# Patient Record
Sex: Male | Born: 1960 | Race: Asian | Hispanic: No | Marital: Married | Smoking: Former smoker
Health system: Southern US, Community
[De-identification: ages and names within clinical notes are randomized; demographics above are authoritative.]

## PROBLEM LIST (undated history)

## (undated) DIAGNOSIS — J189 Pneumonia, unspecified organism: Secondary | ICD-10-CM

## (undated) DIAGNOSIS — Z889 Allergy status to unspecified drugs, medicaments and biological substances status: Secondary | ICD-10-CM

---

## 2009-06-19 ENCOUNTER — Emergency Department (HOSPITAL_COMMUNITY): Admission: EM | Admit: 2009-06-19 | Discharge: 2009-06-19 | Payer: Self-pay | Admitting: Family Medicine

## 2011-10-26 ENCOUNTER — Emergency Department (INDEPENDENT_AMBULATORY_CARE_PROVIDER_SITE_OTHER): Payer: Self-pay

## 2011-10-26 ENCOUNTER — Emergency Department (INDEPENDENT_AMBULATORY_CARE_PROVIDER_SITE_OTHER)
Admission: EM | Admit: 2011-10-26 | Discharge: 2011-10-26 | Disposition: A | Discharge status: Home / Self Care | Payer: Self-pay | Source: Home / Self Care

## 2011-10-26 ENCOUNTER — Encounter (HOSPITAL_COMMUNITY): Payer: Self-pay | Admitting: *Deleted

## 2011-10-26 DIAGNOSIS — J40 Bronchitis, not specified as acute or chronic: Secondary | ICD-10-CM

## 2011-10-26 HISTORY — DX: Allergy status to unspecified drugs, medicaments and biological substances: Z88.9

## 2011-10-26 HISTORY — DX: Pneumonia, unspecified organism: J18.9

## 2011-10-26 LAB — POCT I-STAT, CHEM 8
Chloride: 103 mEq/L (ref 96–112)
HCT: 48 % (ref 39.0–52.0)
Hemoglobin: 16.3 g/dL (ref 13.0–17.0)
Potassium: 4.1 mEq/L (ref 3.5–5.1)
Sodium: 141 mEq/L (ref 135–145)

## 2011-10-26 MED ORDER — AZITHROMYCIN 250 MG PO TABS: ORAL_TABLET | ORAL | Status: AC

## 2011-10-26 NOTE — ED Notes (Signed)
[place] interpretor # [id number] discharge instructions

## 2011-10-26 NOTE — ED Notes (Signed)
Pt son Carolyne Littles interpreting and Radio producer number [phone] nap[place] language also used for further interpretation

## 2011-10-26 NOTE — ED Notes (Signed)
Pt with c/o of cough/congestion/chest pain/aching all over/chills onset last night per pt pain with coughing and sob with coughing - chest pain only with coughing - headache and aching all over continuous

## 2011-10-26 NOTE — ED Provider Notes (Cosign Needed)
History    [facility]-No PCP   CSN: [id number]  Arrival date & time 10/26/11  1231   None     Chief Complaint  Patient presents with  . Cough  . Nasal Congestion  . Chest Pain  . Headache  . Chills  . Generalized Body Aches    Daughter and Son in room Patient discussed with the help of an interpreter.  51 yr old [place] male presented to [facility] for Fever, headache and vomiting and cough.  He states that this started with a runny nose, headache irritation of the throat and fever-this started maybe  7/19 am. Did not try anything for this-took no meds Vomiting occurs post-tussively after coughing a lot.  Has vomited maybe about the 3-4 times and not today at all so far today. Has not eaten anything today so far-has malaise.  States he has had CP when he coughs.  No CP without the coughing-The pain does not radiate to the arm or to the neck. No diarrhoea Hasn't been able to keep down fluids, but is able to do this now and is able to keep down fluids. Has  No ill-contacts at home but maybe someone was sick at work   HPI    Past Medical History  Diagnosis Date  . Hx of seasonal allergies   . Pneumonia     History reviewed. No pertinent past surgical history.  History reviewed. No pertinent family history.  History  Substance Use Topics  . Smoking status: Former Smoker    Quit date: 10/26/2007  . Smokeless tobacco: Not on file  . Alcohol Use: No      Review of Systems  No blurred or double vision Some mild headache Some mild dizziness when he stands-no falls however Some mild SOB, some difficulty-feels more tired on doing regular activity No LE swelling or other issues Some white coloured sputum or phlegm being produced   Allergies  Review of patient's allergies indicates no known allergies.  Home Medications  No current outpatient prescriptions on file.  BP 109/66  Pulse 94  Temp 98.9 F (37.2 C) (Oral)  Resp 20  SpO2 100%  Physical Exam  ED  Course  Procedures (including critical care time)    EKG= NSR, Pr interval= 0.16, 0 degrees , diffuse St-T elevations, no old to compare with, no q waves-impression is normal  Labs Reviewed - No data to display No results found.   No diagnosis found.    MDM  51 year old and a probably male presents with classic symptoms of bronchitis. He does have some chest pain but does not have any cardiac component to this by my exam Patient has mainly shortness of breath and could potentially have a bacterial bronchitis. He had some nausea and vomiting but his creatinine is 1.01 and I have nothing to compare this with. His chest x-ray does not show any specific findings at present time the meeting this is pneumonia  As he is able to take by mouth and was not able to take by mouth earlier last night I do believe it is relatively safe to send him home on a Z-Pak. Patient understands that he needs to present himself if he does have further issues with regards to the same I discussed the plan of care with his family as well as with an interpreter to help determine if the patient didn't comply with the same. Patient and family expressed good understanding of the same.  Rhetta Mura, MD 10/26/11 1718

## 2013-05-07 ENCOUNTER — Ambulatory Visit: Payer: Self-pay | Admitting: Medical

## 2013-05-11 ENCOUNTER — Encounter: Payer: Self-pay | Admitting: Medical

## 2013-05-11 ENCOUNTER — Ambulatory Visit (INDEPENDENT_AMBULATORY_CARE_PROVIDER_SITE_OTHER): Payer: No Typology Code available for payment source | Admitting: Medical

## 2013-05-11 VITALS — BP 98/70 | HR 62 | Temp 98.1°F | Resp 16 | Wt 152.0 lb

## 2013-05-11 DIAGNOSIS — M7661 Achilles tendinitis, right leg: Secondary | ICD-10-CM

## 2013-05-11 DIAGNOSIS — L309 Dermatitis, unspecified: Secondary | ICD-10-CM

## 2013-05-11 DIAGNOSIS — L259 Unspecified contact dermatitis, unspecified cause: Secondary | ICD-10-CM

## 2013-05-11 DIAGNOSIS — M722 Plantar fascial fibromatosis: Secondary | ICD-10-CM

## 2013-05-11 DIAGNOSIS — M7662 Achilles tendinitis, left leg: Principal | ICD-10-CM

## 2013-05-11 DIAGNOSIS — M7660 Achilles tendinitis, unspecified leg: Secondary | ICD-10-CM

## 2013-05-11 NOTE — Patient Instructions (Signed)
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

## 2013-05-11 NOTE — Progress Notes (Signed)
  Subjective:  Luis Newman is a 53 y.o. male who presents as a new patient.  Originally from [place].  His son is here translating for us.    He has been in good health, no specific medical problems  His first concern is 9mo hx/o pains in bottom of feet mainly heels, and achilles.  Walks a lot at work, all day long.  Walks on hard concrete all day, wears black dress shoes all day long.  No other exercise or activities, no recent changes in this activity. He has tried nothing to treat this. His pain typically starts right in the morning before he even gets out of bed he has pain in the feet.  He denies numbness, tingling, weakness, back pain, knee pain, hip pain.  Denies burning pain, no polyuria, polydipsia, blurred vision.  He notes an ongoing problem with dry itchy skin on his elbows knees and tops of his feet this has been going on for years, using nothing for this.  No specific history of allergies or asthma.  No other aggravating or relieving factors.    No other c/o.  The following portions of the patient's history were reviewed and updated as appropriate: allergies, current medications, past family history, past medical history, past social history, past surgical history and problem list.  ROS Otherwise as in subjective above  Objective: Physical Exam  BP 98/70  Pulse 62  Temp(Src) 98.1 F (36.7 C) (Oral)  Resp 16  Wt 152 lb (68.947 kg)   General appearance: alert, no distress, WD/WN, lean male Skin: Dry rough skin of elbows knees and tops of feet at the crease of the ankle bilaterally MSK: Tender bilateral Achilles and heels to palpation, mild pain with ankle range of motion at the Achilles, tender when standing on his heels, otherwise lower extremities are nontender normal range of motion no deformity throughout Feet and lower extremities neurovascularly intact Extremities: no edema  Assessment: Encounter Diagnoses    Name Primary?  . Achilles tendonitis, bilateral Yes  . Plantar fasciitis   . Eczema      Plan: I discussed the diagnoses, treatment recommendations, causes, timeframe to see symptoms improve.  Gave a list of recommendations below, advise return as needed or within one to 2 months if symptoms are not improving  Patient Instructions  Achilles Tendonitis and Plantar Fascitis  Recommendations:  Recognize that it sometimes takes 2- 3 months for this to totally resolve  Consider wearing black athletic (walking/balance/comfort) shoes at work instead of dress shoes  Ice the foot and back of the leg for 20 minutes at a time, for 2-3 times each evening when he gets home  Rest the feet/elevated the legs when he gets home from work  Begin OTC Aleve, 1 tablet twice daily for the next few weeks  Consider wearing night time splint (see script)   Shoe stores  [employer]  Shoe store [employer] [facility] close to [employer]  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

## 2013-07-12 ENCOUNTER — Telehealth: Payer: Self-pay | Admitting: Medical

## 2013-07-12 ENCOUNTER — Ambulatory Visit (INDEPENDENT_AMBULATORY_CARE_PROVIDER_SITE_OTHER): Payer: No Typology Code available for payment source | Admitting: Medical

## 2013-07-12 ENCOUNTER — Encounter: Payer: Self-pay | Admitting: Medical

## 2013-07-12 VITALS — BP 100/70 | HR 80 | Temp 98.0°F | Resp 14 | Wt 162.0 lb

## 2013-07-12 DIAGNOSIS — M79671 Pain in right foot: Secondary | ICD-10-CM

## 2013-07-12 DIAGNOSIS — M79672 Pain in left foot: Secondary | ICD-10-CM

## 2013-07-12 DIAGNOSIS — M79609 Pain in unspecified limb: Secondary | ICD-10-CM

## 2013-07-12 DIAGNOSIS — M7660 Achilles tendinitis, unspecified leg: Secondary | ICD-10-CM

## 2013-07-12 DIAGNOSIS — M722 Plantar fascial fibromatosis: Secondary | ICD-10-CM

## 2013-07-12 LAB — BASIC METABOLIC PANEL
BUN: 10 mg/dL (ref 6–23)
CHLORIDE: 103 meq/L (ref 96–112)
CO2: 29 meq/L (ref 19–32)
Calcium: 9.3 mg/dL (ref 8.4–10.5)
Creat: 0.92 mg/dL (ref 0.50–1.35)
GLUCOSE: 73 mg/dL (ref 70–99)
POTASSIUM: 4 meq/L (ref 3.5–5.3)
Sodium: 140 mEq/L (ref 135–145)

## 2013-07-12 LAB — CBC WITH DIFFERENTIAL/PLATELET
BASOS ABS: 0 10*3/uL (ref 0.0–0.1)
Basophils Relative: 0 % (ref 0–1)
EOS ABS: 0.3 10*3/uL (ref 0.0–0.7)
EOS PCT: 3 % (ref 0–5)
HCT: 47.1 % (ref 39.0–52.0)
Hemoglobin: 15.3 g/dL (ref 13.0–17.0)
LYMPHS ABS: 2.1 10*3/uL (ref 0.7–4.0)
Lymphocytes Relative: 24 % (ref 12–46)
MCH: 32.4 pg (ref 26.0–34.0)
MCHC: 32.5 g/dL (ref 30.0–36.0)
MCV: 99.8 fL (ref 78.0–100.0)
MONOS PCT: 7 % (ref 3–12)
Monocytes Absolute: 0.6 10*3/uL (ref 0.1–1.0)
Neutro Abs: 5.7 10*3/uL (ref 1.7–7.7)
Neutrophils Relative %: 66 % (ref 43–77)
PLATELETS: 239 10*3/uL (ref 150–400)
RBC: 4.72 MIL/uL (ref 4.22–5.81)
RDW: 13.7 % (ref 11.5–15.5)
WBC: 8.7 10*3/uL (ref 4.0–10.5)

## 2013-07-12 MED ORDER — TRAMADOL HCL 50 MG PO TABS: 50.0000 mg | ORAL_TABLET | Freq: Four times a day (QID) | ORAL | Status: DC | PRN

## 2013-07-12 NOTE — Telephone Encounter (Signed)
Refer to podiatry for achilles tendonitis and plantar fascitis

## 2013-07-12 NOTE — Progress Notes (Signed)
  Subjective:  Luis Newman is a 53 y.o. male who presents for recheck.  Originally from [place].   Here with translator for today    He has been in good health.  I saw him in February for the same problems which are basically unchanged, not improving  His first concern is 36mo hx/o pains in bottom of feet mainly heels, and achilles.  Walks a lot at work, all day long.  Walks on hard concrete all day, wears black dress shoes all day long.  No other exercise or activities, no recent changes in this activity. He has tried nothing to treat this. His pain typically starts right in the morning before he even gets out of bed he has pain in the feet.  He denies numbness, tingling, weakness, back pain, knee pain, hip pain.  Denies burning pain, no polyuria, polydipsia, blurred vision. Denies restless legs. Denies calf pain. No chest pain, shortness of breath, claudication. No other aggravating or relieving factors.    No other c/o.  The following portions of the patient's history were reviewed and updated as appropriate: allergies, current medications, past family history, past medical history, past social history, past surgical history and problem list.  ROS Otherwise as in subjective above  Objective: Physical Exam  BP 100/70  Pulse 80  Temp(Src) 98 F (36.7 C) (Oral)  Resp 14  Wt 162 lb (73.483 kg)   General appearance: alert, no distress, WD/WN, lean male Skin: Warm, dry MSK: Tender bilateral Achilles and heels to palpation, mild pain with ankle range of motion at the Achilles, tender over the bilateral plantar fascia, tender over bilateral heels, tender when standing on his heels, otherwise lower extremities are nontender normal range of motion no deformity throughout Feet and lower extremities neurovascularly intact Extremities: no edema   Assessment: Encounter Diagnoses  Name Primary?  . Plantar fasciitis Yes  . Achilles  tendonitis   . Foot pain, bilateral      Plan: After last visit he did end up using the things we advise including ice pack daily, daily towel stretch, tennis ball massage, ibuprofen over-the-counter.  He did not change shoes or get new shoes, he did not get a foot splint.  At this point given his 6+ month history of symptoms or no improvement despite conservative care, referral to podiatry.  Labs today for further evaluation to ensure no anemia or electrolyte dysfunction

## 2013-07-13 ENCOUNTER — Encounter: Payer: Self-pay | Admitting: Family Medicine

## 2013-07-13 NOTE — Telephone Encounter (Signed)
Working on the referral. CLS 

## 2013-07-19 ENCOUNTER — Ambulatory Visit: Payer: No Typology Code available for payment source | Admitting: Medical

## 2013-07-30 ENCOUNTER — Encounter: Payer: Self-pay | Admitting: Podiatry

## 2013-07-30 ENCOUNTER — Ambulatory Visit (INDEPENDENT_AMBULATORY_CARE_PROVIDER_SITE_OTHER): Payer: No Typology Code available for payment source

## 2013-07-30 VITALS — BP 136/86 | HR 59 | Resp 15 | Ht 68.0 in | Wt 160.9 lb

## 2013-07-30 DIAGNOSIS — M722 Plantar fascial fibromatosis: Secondary | ICD-10-CM

## 2013-07-30 DIAGNOSIS — M79609 Pain in unspecified limb: Secondary | ICD-10-CM

## 2013-07-30 DIAGNOSIS — M7660 Achilles tendinitis, unspecified leg: Secondary | ICD-10-CM

## 2013-07-30 MED ORDER — MELOXICAM 15 MG PO TABS: 15.0000 mg | ORAL_TABLET | Freq: Every day | ORAL | Status: DC

## 2013-07-30 NOTE — Patient Instructions (Signed)
ICE INSTRUCTIONS  Apply ice or cold pack to the affected area at least 3 times a day for 10-15 minutes each time.  You should also use ice after prolonged activity or vigorous exercise.  Do not apply ice longer than 20 minutes at one time.  Always keep a cloth between your skin and the ice pack to prevent burns.  Being consistent and following these instructions will help control your symptoms.  We suggest you purchase a gel ice pack because they are reusable and do bit leak.  Some of them are designed to wrap around the area.  Use the method that works best for you.  Here are some other suggestions for icing.   Use a frozen bag of peas or corn-inexpensive and molds well to your body, usually stays frozen for 10 to 20 minutes.  Wet a towel with cold water and squeeze out the excess until it's damp.  Place in a bag in the freezer for 20 minutes. Then remove and use.  Apply ice to the heels every evening. Repeat the ice 2 or 3 times to each foot.  Cannot walk barefoot maintain a shoe or sandal around the house such as a croc or Birkenstock-type sandal or clog

## 2013-07-30 NOTE — Progress Notes (Signed)
   Subjective:    Patient ID: Luis Newman, male    DOB: 1960-04-17, 53 y.o.   MRN: [id number]  HPI Comments: N foot pain L B/L heels and arches D 8 months O  C shooting pain when stepping, tenderness when seated A worse in morning and after rest, and long period of walking T massage in warm shower  Foot Pain      Review of Systems  All other systems reviewed and are negative.      Objective:   Physical Exam Vascular status is intact with pedal pulses palpable DP postal for PT plus one over 4 capillary refill time 3 seconds all digits epicritic and proprioceptive sensations intact and symmetric bilateral is normal plantar response DTRs not listed neurologically skin color pigment and hair growth are normal orthopedic biomechanical exam rectus foot type ankle mid tarsus subtalar joint motions normal x-rays reveal inferior calcaneal spurring some thickening plantar fascial structures. Patient has pain inferior calcaneal tubercle a medial band of the plantar fascia also some Achilles tendinitis pain also to the ball of foot possibly due to compensatory gait changes patient wearing shoes at work however: From barefoot without shoes. Patient indicates through a family member translator at this time is advised that fascial strapping applied maintain shoes at all times consider crocs or Birkenstock's roundhouse no barefoot or flimsy shoes or flip-flops also ice and NSAID are prescribed at this time MOBIC 15 g once daily       Assessment & Plan:  Assessment plantar fasciitis/heel spur syndrome bilateral with associated Achilles tendinitis as well as posse some capsulitis of first MTP joint plan at this time fascial strapping applied recommend ice MOBIC and crocs for around the house no barefoot or flimsy shoes or flip-flops reappointed 2 weeks for reevaluation which on fasciitis also dispensed at this time.  Alvan Dameichard Siris Hoos DPM

## 2013-08-03 ENCOUNTER — Telehealth: Payer: Self-pay | Admitting: *Deleted

## 2013-08-03 NOTE — Telephone Encounter (Signed)
He had an appointment on the 23rd.  Told to leave tape on for 5 days.  He took it off today, he feel pain now.  [facility]?  I returned his call and informed him that the tape we use cannot be purchased at [facility].  I offered him to schedule his dad an appointment to have his foot taped again.  He stated he would call back to schedule.

## 2013-08-13 ENCOUNTER — Ambulatory Visit (INDEPENDENT_AMBULATORY_CARE_PROVIDER_SITE_OTHER): Payer: No Typology Code available for payment source

## 2013-08-13 ENCOUNTER — Ambulatory Visit: Payer: No Typology Code available for payment source

## 2013-08-13 VITALS — BP 131/82 | HR 72 | Resp 16

## 2013-08-13 DIAGNOSIS — M722 Plantar fascial fibromatosis: Secondary | ICD-10-CM

## 2013-08-13 DIAGNOSIS — M7660 Achilles tendinitis, unspecified leg: Secondary | ICD-10-CM

## 2013-08-13 DIAGNOSIS — M79609 Pain in unspecified limb: Secondary | ICD-10-CM

## 2013-08-13 NOTE — Progress Notes (Signed)
   Subjective:    Patient ID: Luis Newman, male    DOB: 04/02/1961, 53 y.o.   MRN: [id number]  HPI Comments: "It is better. He still has some pain"  Plantar Fasciitis - Follow up bilateral heels      Review of Systems no new systemic changes or findings noted     Objective:   Physical Exam Neurovascular status is intact pedal pulses palpable epicritic and proprioceptive sensations intact there was definite improvement from the strapping therefore a functional orthoses may be beneficial. At this time dispensed power step orthotics for his use with written instructions been given.       Assessment & Plan:  Assessment plantar fasciitis/heel spur syndrome and Achilles tendinitis improving fascial strapping and MOBIC continue with MOBIC and ice if needed maintain orthotics with written instructions given work restrictions for the next 60 days no lifting above 25 pounds no climbing bending or stooping. No running or high-impact activities return in 2 months for long-term followup and he still has knee pain or symptomology or no improvement a candidate for more aggressive options possible steroid injection etc.  Alvan Dameichard Rea Kalama DPM

## 2013-08-13 NOTE — Patient Instructions (Signed)

## 2013-08-17 ENCOUNTER — Ambulatory Visit: Payer: No Typology Code available for payment source

## 2013-09-01 ENCOUNTER — Ambulatory Visit (INDEPENDENT_AMBULATORY_CARE_PROVIDER_SITE_OTHER): Payer: No Typology Code available for payment source | Admitting: Medical

## 2013-09-01 ENCOUNTER — Encounter: Payer: Self-pay | Admitting: Medical

## 2013-09-01 VITALS — BP 100/70 | HR 68 | Temp 98.1°F | Resp 16 | Wt 161.0 lb

## 2013-09-01 DIAGNOSIS — R079 Chest pain, unspecified: Secondary | ICD-10-CM

## 2013-09-01 DIAGNOSIS — L723 Sebaceous cyst: Secondary | ICD-10-CM

## 2013-09-01 DIAGNOSIS — M549 Dorsalgia, unspecified: Secondary | ICD-10-CM

## 2013-09-01 NOTE — Progress Notes (Signed)
Subjective: Here for back pain.  accompanied by son who interprets.    He notes 10 mo hx/o back pain in mid/upper back that has gotten worse over time.  Lately extreme pain.   Worse pain when lying flat.  Had a fall back in [place] in 2000, fell 8' out of a building, landed on his side,arm, leg, broke right hand.  Has some right hand pain occasionally and foot problems, but no weakness, no numbness, no tingling.  He only feels pain in chest if the back pain is present. But no chest pain, no SOB.   No recent fever, nausea, vomiting, no belly pain. No problems with voiding or stooling.    He does report back and chest pain at times with activity/exercise.  No other aggravating or relieving factors.  No other c/o.  Past Medical History  Diagnosis Date  . Hx of seasonal allergies   . Pneumonia    No past surgical history on file.   ROS as in subjective  Objective: Filed Vitals:   09/01/13 1043  BP: 100/70  Pulse: 68  Temp: 98.1 F (36.7 C)  Resp: 16    General appearance: alert, no distress, WD/WN Neck: supple, no lymphadenopathy, no thyromegaly, no masses Heart: RRR, normal S1, S2, no murmurs Lungs: CTA bilaterally, no wheezes, rhonchi, or rales Abdomen: +bs, soft, non tender, non distended, no masses, no hepatomegaly, no splenomegaly Pulses: 2+ symmetric, upper and lower extremities, normal cap refill Back: only tender over left upper sebaceous cyst, normal ROM, no scoliosis Chest wall nontender MSK: UE and LE nontender, normal ROM Arms and legs neurovascularly intact    Adult ECG Report  Indication: chest pain  Rate: 63bpm  Rhythm: normal sinus rhythm  QRS Axis: 20 degrees  PR Interval:  QRS Duration: 25ms  QTc:  Conduction Disturbances: none  Other Abnormalities: none  Patient's cardiac risk factors are: male gender.  EKG comparison: 2013 EKG, no changes  Narrative Interpretation: normal EKG     Assessment: Encounter Diagnoses  Name Primary?  Marland Kitchen Upper  back pain Yes  . Chest pain   . Sebaceous cyst    Plan: Upper back pain, chest pain - advised that the sebaceous cyst should not be causing the type of pain he is describing.  But etiology unclear.   Will get thoracici spine xray, EKG reviewed.  Consider echo to rule out thoracic aortic aneurysm, but seems low risk.  He does have 5 year hx/o tobacco in remote past.   Sebaceous cyst - disucssed diagnosis, treatment options.  Consider excision.

## 2013-09-06 ENCOUNTER — Ambulatory Visit
Admission: RE | Admit: 2013-09-06 | Discharge: 2013-09-06 | Disposition: A | Discharge status: Home / Self Care | Payer: No Typology Code available for payment source | Source: Ambulatory Visit | Attending: Medical | Admitting: Medical

## 2013-09-06 DIAGNOSIS — R079 Chest pain, unspecified: Secondary | ICD-10-CM

## 2013-09-06 DIAGNOSIS — M549 Dorsalgia, unspecified: Secondary | ICD-10-CM

## 2013-09-08 ENCOUNTER — Encounter: Payer: Self-pay | Admitting: Family Medicine

## 2013-09-21 ENCOUNTER — Other Ambulatory Visit: Payer: Self-pay | Admitting: Family Medicine

## 2013-09-21 ENCOUNTER — Telehealth (HOSPITAL_COMMUNITY): Payer: Self-pay | Admitting: *Deleted

## 2013-09-21 DIAGNOSIS — R011 Cardiac murmur, unspecified: Secondary | ICD-10-CM

## 2013-10-12 ENCOUNTER — Ambulatory Visit (HOSPITAL_COMMUNITY)
Admission: RE | Admit: 2013-10-12 | Discharge: 2013-10-12 | Disposition: A | Discharge status: Home / Self Care | Payer: No Typology Code available for payment source | Source: Ambulatory Visit | Attending: Internal Medicine | Admitting: Internal Medicine

## 2013-10-12 DIAGNOSIS — R011 Cardiac murmur, unspecified: Secondary | ICD-10-CM | POA: Insufficient documentation

## 2013-10-12 NOTE — Progress Notes (Signed)
2D Echocardiogram Complete.  10/12/2013   Filip Luten, RDCS  

## 2013-10-13 ENCOUNTER — Encounter: Payer: Self-pay | Admitting: Family Medicine

## 2013-10-15 ENCOUNTER — Ambulatory Visit: Payer: No Typology Code available for payment source

## 2013-12-03 ENCOUNTER — Ambulatory Visit (INDEPENDENT_AMBULATORY_CARE_PROVIDER_SITE_OTHER): Payer: No Typology Code available for payment source | Admitting: Medical

## 2013-12-03 VITALS — BP 100/78 | HR 68 | Temp 97.6°F | Resp 14 | Wt 157.0 lb

## 2013-12-03 DIAGNOSIS — IMO0001 Reserved for inherently not codable concepts without codable children: Secondary | ICD-10-CM

## 2013-12-03 DIAGNOSIS — L723 Sebaceous cyst: Secondary | ICD-10-CM

## 2013-12-03 DIAGNOSIS — Z23 Encounter for immunization: Secondary | ICD-10-CM

## 2013-12-03 MED ORDER — IBUPROFEN 600 MG PO TABS: 600.0000 mg | ORAL_TABLET | Freq: Three times a day (TID) | ORAL | Status: DC | PRN

## 2013-12-03 MED ORDER — CYCLOBENZAPRINE HCL 5 MG PO TABS: 5.0000 mg | ORAL_TABLET | Freq: Every day | ORAL | Status: DC

## 2013-12-03 NOTE — Progress Notes (Signed)
  Subjective:   Luis Newman is a 53 y.o. male who presents for back pain that he attributes to the cyst on his back we discussed last visit.  Here with translator.  He still feels achy in the back, sometimes aches in arms and legs.  He thinks the sebaceous cyst is pressing on his spinal nerves. Lesion is located in the upper back between the shoulder blades.  The cyst has been there for at least a year, but hasn't gotten very big, just very unknown. He does squeeze odorous cheesy material out of the wound periodically.  Denies fever, nausea, vomiting.  Patient does not have previous history of cutaneous abscesses.   Patient denies hx/o MRSA.  Patient denies hx/o I&D for similar.  Patient does not have diabetes.  Patient denies hx/o poor wound healing, compromised immunity or HIV.  No other aggravating or relieving factors.    Wants flu shot today.  No other c/o.  Past Medical History  Diagnosis Date  . Hx of seasonal allergies   . Pneumonia    Reviewed prior allergies, medications, past medical history, past surgical history.  ROS as in subjective   Objective:   Filed Vitals:   12/03/13 1136  BP: 100/78  Pulse: 68  Temp: 97.6 F (36.4 C)  Resp: 14    Gen: wd, wn, nad Skin: upper middle of back with slight roundish area approx 1.2cm diameter with central pore, however on last exam the area was somewhat more round and prominent.  Mostly flat today.  No fluctuance, erythema, or induration.   Back and MSK nontender, normal arm and leg ROM   Assessment:   Encounter Diagnoses  Name Primary?  . Sebaceous cyst Yes  . Myalgia and myositis   . Need for prophylactic vaccination and inoculation against influenza      Plan:   Discussed examination findings, diagnosis, and options for therapy discussed. I reassured him that this was a cyst of the skin and not a deeper lesion affecting the muscles or spinal cord.  I believe he is ruminating about the back pain and the cyst leading to  some psychosomatic symptoms. Therefore did give him some ibuprofen and Flexeril as needed for aches and soreness.  After discussing recommendations, risks/benefits, possible complications, patient agrees to I&D/vs excision.   Did advised that I can't 100% assure that the entire cyst sac will be removed given the flatness of the lesion today from where he recently squeezed the debris out.  But we would make every effort to get the sac out.    Procedure Informed consent obtained.  The upper middle back area was prepped in the usual manner and the skin was anesthetized with 2.5cc of 1% lidocaine with epinephrine.  Elliptical incision made, given the deflated sac present, expressed approx 2 cc of cheesy material and the sac came out broke apart in pieces somewhat.  Area was irrigated with high pressure saline. Closed wound with 1 Nylov 5.0 suture simple interrupted. Wound was covered with sterile bandage.  Discussed wound care.  Follow up: 1wk.  However, if worse signs of infections as discussed (fever, chills, nausea, vomiting, worsening redness, worsening pain), then call or return immediately.  Counseled on the influenza virus vaccine.  Vaccine information sheet given.  Influenza vaccine given after consent obtained.

## 2013-12-10 ENCOUNTER — Ambulatory Visit (INDEPENDENT_AMBULATORY_CARE_PROVIDER_SITE_OTHER): Payer: No Typology Code available for payment source | Admitting: Medical

## 2013-12-10 DIAGNOSIS — Z4802 Encounter for removal of sutures: Secondary | ICD-10-CM

## 2013-12-10 MED ORDER — CYCLOBENZAPRINE HCL 5 MG PO TABS: 5.0000 mg | ORAL_TABLET | Freq: Every day | ORAL | Status: AC

## 2013-12-10 MED ORDER — IBUPROFEN 600 MG PO TABS: 600.0000 mg | ORAL_TABLET | Freq: Three times a day (TID) | ORAL | Status: AC | PRN

## 2013-12-10 NOTE — Progress Notes (Signed)
Subjective: Here for suture removal. I saw him a week ago for procedure for cyst removal of upper back. Doing well without complaint  Objective: General: No acute distress Skin: Upper back with small centimeter linear scar and 1 single nylon suture interrupted, no warmth no fluctuance, granulation tissue present and appears healing appropriately   Assessment: Encounter Diagnosis  Name Primary?  . Visit for suture removal Yes   Plan: Cleaned and prepped upper back, remove one single interrupted suture, patient tolerated procedure well, discussed wound care, return when necessary

## 2013-12-10 NOTE — Patient Instructions (Addendum)
Recommendations  He may use Ibuprofen for pain as needed up to every 6 hours  He may use Flexeril/cyclobenzaprine muscle relaxer as needed at bedtime or up to twice daily for muscle soreness, but this will cause drowsiness  He can use [employer] or Vit E cream OTC to the back wound daily for a few weeks  Consider massage   Massage Therapy:  [facility] [address] [place] [place], [place] [address] [phone] [email or web address]   At some point in the coming months, if he wants to do a physical, we can check thyroid, prostate, cholesterol, set him up for colon cancer screening/colonoscopy referral.    It would be helpful for him to get [place] copy of his vaccine records.  When he comes in for physical, don't eat or drink after bedtime the night before he comes in.   Overall he seems healthy.

## 2015-01-11 IMAGING — CR DG THORACIC SPINE 3V
3 series · 3 of 3 positions shown · non-contrast
Comparison: Chest radiograph 10/26/2011.

CLINICAL DATA: Upper back pain.

EXAM:
THORACIC SPINE - 2 VIEW + SWIMMERS

[t t-spine a.p.]
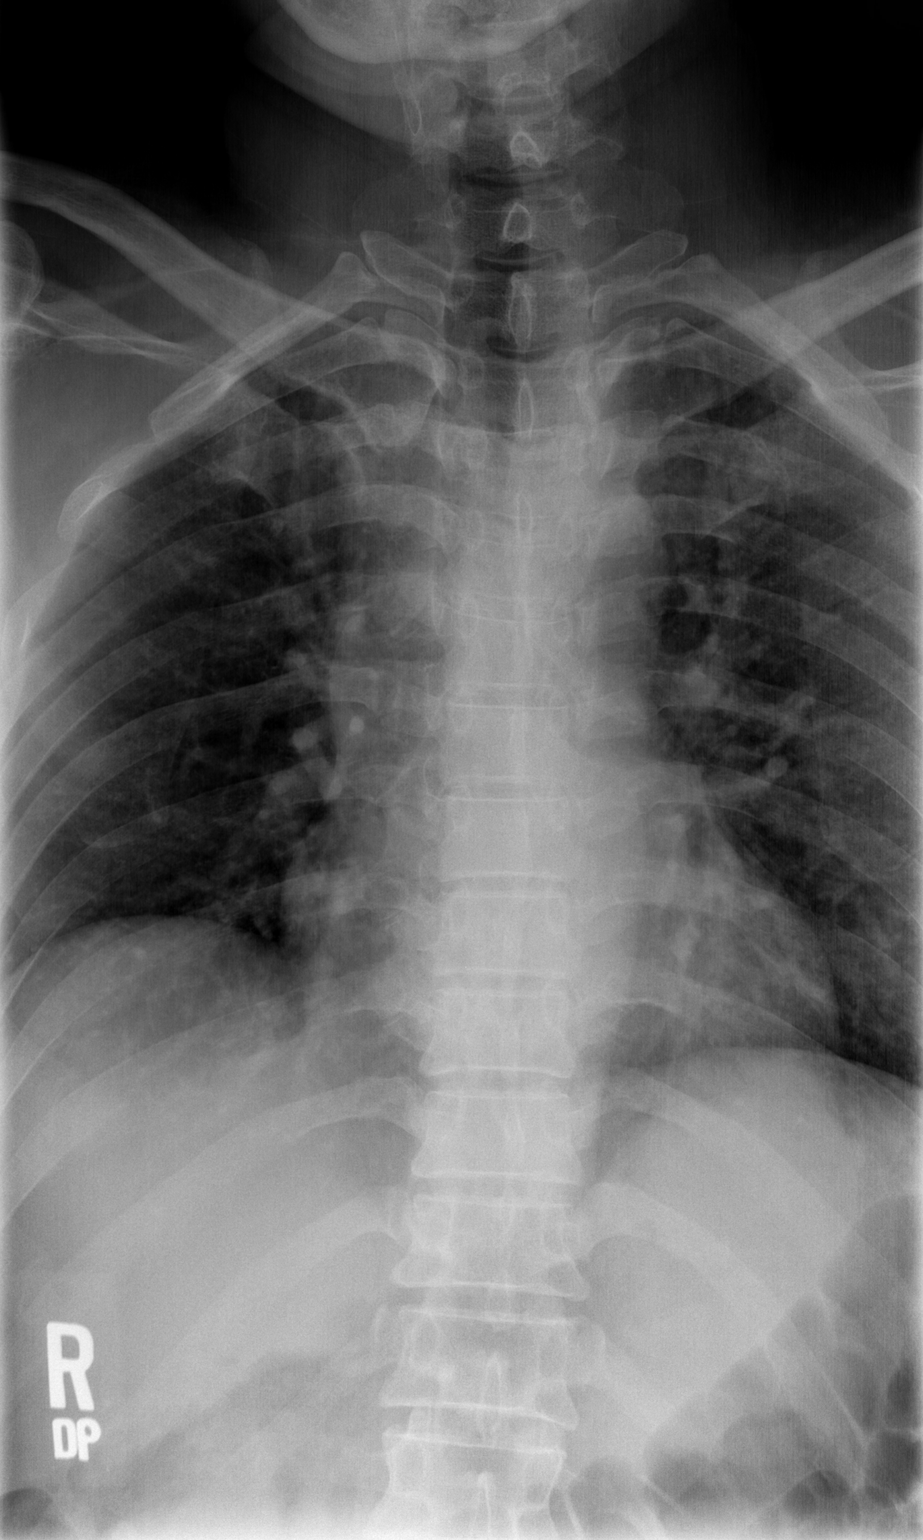

[t t-spine lat *]
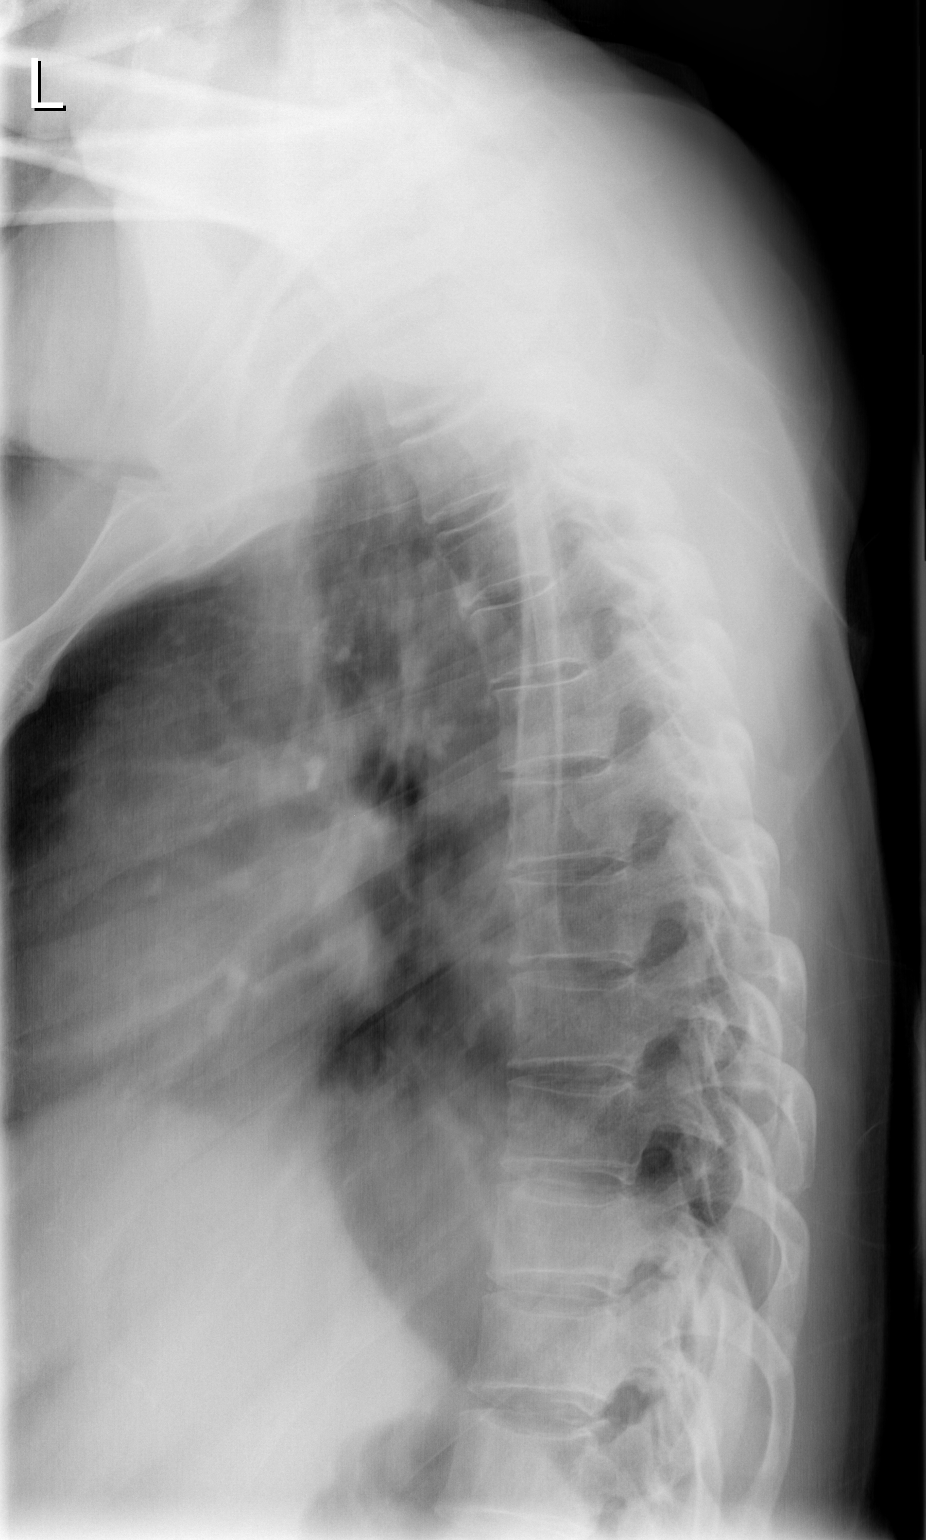

[t swimmers]
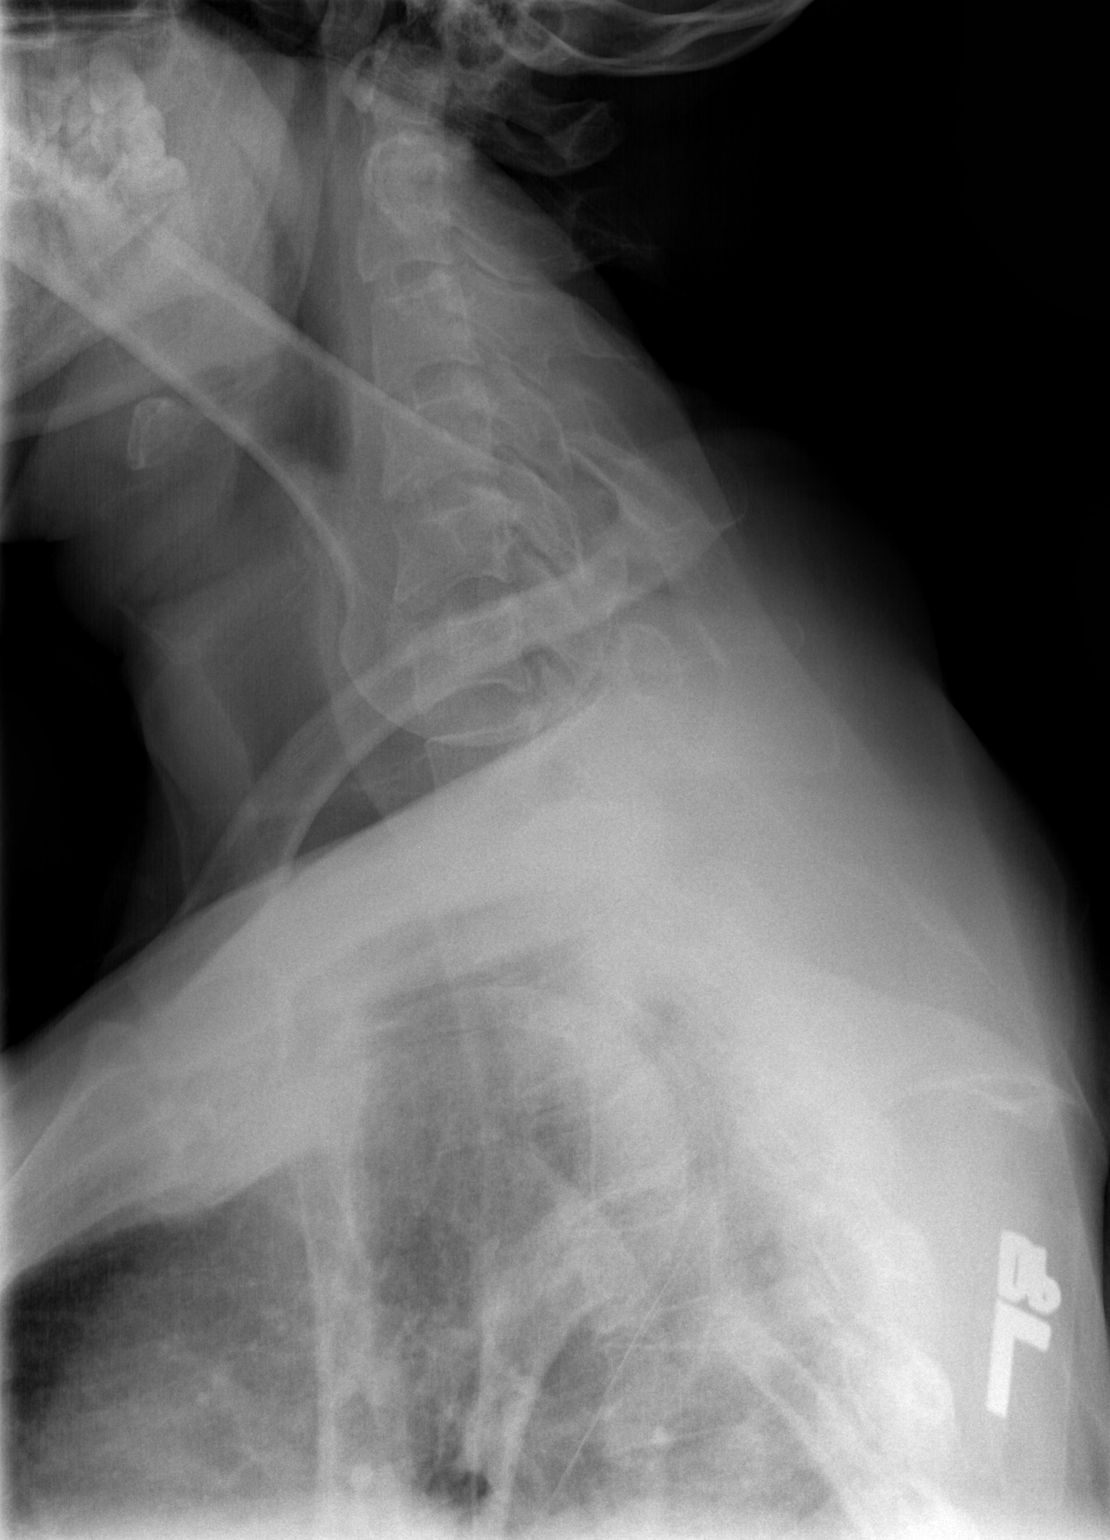

[3 of 3 positions shown; findings below may reference images not displayed]

FINDINGS: Alignment is anatomic. Vertebral body and disc space height are
maintained. Very minimal endplate degenerative change. All
cervicothoracic junction is in alignment. Visualized portion of the
chest is unremarkable.
IMPRESSION: Very minimal spondylosis.  No acute findings.
